# Patient Record
Sex: Male | Born: 1990 | Race: Black or African American | Hispanic: No | Marital: Single | Smoking: Never smoker
Health system: Southern US, Community
[De-identification: ages and names within clinical notes are randomized; demographics above are authoritative.]

---
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
## 2013-09-16 ENCOUNTER — Emergency Department (HOSPITAL_COMMUNITY)
Admission: EM | Admit: 2013-09-16 | Discharge: 2013-09-17 | Disposition: A | Discharge status: Home / Self Care | Payer: Worker's Compensation | Attending: Emergency Medicine | Admitting: Emergency Medicine

## 2013-09-16 ENCOUNTER — Encounter (HOSPITAL_COMMUNITY): Payer: Self-pay | Admitting: Emergency Medicine

## 2013-09-16 ENCOUNTER — Emergency Department (HOSPITAL_COMMUNITY): Payer: Worker's Compensation

## 2013-09-16 DIAGNOSIS — W319XXA Contact with unspecified machinery, initial encounter: Secondary | ICD-10-CM | POA: Insufficient documentation

## 2013-09-16 DIAGNOSIS — S61402A Unspecified open wound of left hand, initial encounter: Secondary | ICD-10-CM

## 2013-09-16 DIAGNOSIS — Y939 Activity, unspecified: Secondary | ICD-10-CM | POA: Insufficient documentation

## 2013-09-16 DIAGNOSIS — S61209A Unspecified open wound of unspecified finger without damage to nail, initial encounter: Secondary | ICD-10-CM | POA: Insufficient documentation

## 2013-09-16 DIAGNOSIS — Y929 Unspecified place or not applicable: Secondary | ICD-10-CM | POA: Insufficient documentation

## 2013-09-16 MED ORDER — CEFAZOLIN SODIUM 1-5 GM-% IV SOLN
1.0000 g | Freq: Once | INTRAVENOUS | Status: AC
  Administered 2013-09-17: 1 g via INTRAVENOUS
  Filled 2013-09-16: qty 50

## 2013-09-16 MED ORDER — HYDROMORPHONE HCL PF 1 MG/ML IJ SOLN
1.0000 mg | Freq: Once | INTRAMUSCULAR | Status: AC
  Administered 2013-09-17: 1 mg via INTRAVENOUS
  Filled 2013-09-16: qty 1

## 2013-09-16 NOTE — ED Notes (Signed)
Pt got left had caught in machine while working; index, middle, and ring fingers. Pt c/o pain 10/10 in field received 200 mg Fent. Pain dropped to 4/10. Pt A&Ox4  Denies LOC. No bleeding noted

## 2013-09-16 NOTE — ED Notes (Signed)
MD at bedside. 

## 2013-09-16 NOTE — ED Provider Notes (Signed)
CSN: [id number]     Arrival date & time 09/16/13  2301 History   First MD Initiated Contact with Patient 09/16/13 2301     Chief Complaint  Patient presents with  . Hand Injury     (Consider location/radiation/quality/duration/timing/severity/associated sxs/prior Treatment) HPI Comments: 23 year old male with no significant medical problems presents with skin avulsion and pain to left index, middle, ring fingers on left hand after hand was caught in a machine work. Patient is constant pain worse with palpation and movement. Patient received fentanyl on route with improved pain.  Tetanus is up-to-date. No allergies. No other injuries.  Patient is a 23 y.o. male presenting with hand injury. The history is provided by the patient.  Hand Injury Associated symptoms: no neck pain     History reviewed. No pertinent past medical history. History reviewed. No pertinent past surgical history. History reviewed. No pertinent family history. History  Substance Use Topics  . Smoking status: Never Smoker   . Smokeless tobacco: Not on file  . Alcohol Use: No    Review of Systems  Respiratory: Negative for shortness of breath.   Cardiovascular: Negative for chest pain.  Gastrointestinal: Negative for vomiting and abdominal pain.  Genitourinary: Negative for dysuria and flank pain.  Musculoskeletal: Negative for neck pain and neck stiffness.  Skin: Positive for wound. Negative for rash.  Neurological: Negative for light-headedness and headaches.      Allergies  Review of patient's allergies indicates not on file.  Home Medications   Prior to Admission medications   Not on File   BP 129/82  Pulse 72  Temp(Src) 97.8 F (36.6 C) (Oral)  Resp 20  SpO2 98% Physical Exam  Nursing note and vitals reviewed. Constitutional: He appears well-developed and well-nourished. No distress.  HENT:  Head: Normocephalic and atraumatic.  Neck: Normal range of motion.  Cardiovascular: Normal rate.    Pulmonary/Chest: Effort normal.  Musculoskeletal: He exhibits tenderness. He exhibits no edema.  Neurological: He is alert.  Skin: Skin is warm.  Patient has skin avulsion left index, middle and ring finger worse middle finger approximately 3 cm of avulsion. Bone not visualized. Pain with any range of motion of those fingers. Sensation intact distally. No pain or involvement of wrist or proximal arm on the left. Difficulty assessing tendon function do to severe pain. Patient's hand is in full extension.  Psychiatric: He has a normal mood and affect.    ED Course  Procedures (including critical care time) Labs Review Labs Reviewed - No data to display  Imaging Review Dg Hand Complete Left  09/17/2013   CLINICAL DATA:  Left hand caught in machine. Pain about the second, third and fourth fingers.  EXAM: LEFT HAND - COMPLETE 3+ VIEW  COMPARISON:  None.  FINDINGS: There is no evidence of fracture or dislocation. The joint spaces are preserved; there is suggestion of mild soft tissue injury about the second, third and fourth digits. The carpal rows are intact, and demonstrate normal alignment.  No radiopaque foreign bodies are seen.  IMPRESSION: No evidence of fracture or dislocation.   Electronically Signed   By: Roanna RaiderJeffery  Chang M.D.   On: 09/17/2013 01:10     EKG Interpretation None      MDM   Final diagnoses:  Avulsion of skin of left hand   Acute hand injury with skin avulsion. Tetanus is up-to-date. Plan for pain medicines, Ancef, x-ray and wound care.  Pain control the ED, Ancef given.  X-ray reviewed no acute fracture.  Followup closely with hand Dr. Pain medicines and antibiotics  Results and differential diagnosis were discussed with the patient. Close follow up outpatient was discussed, patient comfortable with the plan.   Filed Vitals:   09/16/13 2320  BP: 129/82  Pulse: 72  Temp: 97.8 F (36.6 C)  TempSrc: Oral  Resp: 20  SpO2: 98%    Enid SkeensJoshua M Liann Spaeth,  MD 09/17/13 [phone]

## 2013-09-17 MED ORDER — CEPHALEXIN 500 MG PO CAPS: 500.0000 mg | ORAL_CAPSULE | Freq: Two times a day (BID) | ORAL | Status: DC

## 2013-09-17 MED ORDER — HYDROCODONE-ACETAMINOPHEN 5-325 MG PO TABS: 2.0000 | ORAL_TABLET | ORAL | Status: DC | PRN

## 2013-09-17 NOTE — Discharge Instructions (Signed)
Apply antibiotic ointment once daily. Take ibuprofen and use ice for pain. For severe pain take norco or vicodin however realize they have the potential for addiction and it can make you sleepy and has tylenol in it.  No operating machinery while taking. Call orthopedic doctor for recheck. If you were given medicines take as directed.  If you are on coumadin or contraceptives realize their levels and effectiveness is altered by many different medicines.  If you have any reaction (rash, tongues swelling, other) to the medicines stop taking and see a physician.   Please follow up as directed and return to the ER or see a physician for new or worsening symptoms.  Thank you. Filed Vitals:   09/16/13 2320  BP: 129/82  Pulse: 72  Temp: 97.8 F (36.6 C)  TempSrc: Oral  Resp: 20  SpO2: 98%

## 2015-03-21 ENCOUNTER — Emergency Department (HOSPITAL_COMMUNITY)
Admission: EM | Admit: 2015-03-21 | Discharge: 2015-03-21 | Disposition: A | Discharge status: Home / Self Care | Payer: Self-pay | Attending: Emergency Medicine | Admitting: Emergency Medicine

## 2015-03-21 ENCOUNTER — Encounter (HOSPITAL_COMMUNITY): Payer: Self-pay | Admitting: Emergency Medicine

## 2015-03-21 DIAGNOSIS — Z792 Long term (current) use of antibiotics: Secondary | ICD-10-CM | POA: Insufficient documentation

## 2015-03-21 DIAGNOSIS — IMO0002 Reserved for concepts with insufficient information to code with codable children: Secondary | ICD-10-CM

## 2015-03-21 DIAGNOSIS — F10129 Alcohol abuse with intoxication, unspecified: Secondary | ICD-10-CM | POA: Insufficient documentation

## 2015-03-21 NOTE — ED Provider Notes (Signed)
CSN: [id number]     Arrival date & time 03/21/15  1752 History   First MD Initiated Contact with Patient 03/21/15 1824     Chief Complaint  Patient presents with  . Alcohol Intoxication     (Consider location/radiation/quality/duration/timing/severity/associated sxs/prior Treatment) HPI  Drank quite a few beers today and smoked marijuana which he doesn't normally do. Started having 'things fly by my head', jittery, nervous, anxious so brought here. Has improved since being here. No neuro deficits.   History reviewed. No pertinent past medical history. History reviewed. No pertinent past surgical history. No family history on file. Social History  Substance Use Topics  . Smoking status: Never Smoker   . Smokeless tobacco: None  . Alcohol Use: No    Review of Systems  Constitutional: Negative for fever and fatigue.  HENT: Negative for congestion and drooling.   Respiratory: Negative for cough and shortness of breath.   Cardiovascular: Negative for chest pain.  Gastrointestinal: Negative for nausea, vomiting and abdominal pain.  Endocrine: Negative for polydipsia and polyuria.  Genitourinary: Negative for dysuria and hematuria.  Musculoskeletal: Negative for joint swelling.      Allergies  Review of patient's allergies indicates not on file.  Home Medications   Prior to Admission medications   Medication Sig Start Date End Date Taking? Authorizing Provider  cephALEXin (KEFLEX) 500 MG capsule Take 1 capsule (500 mg total) by mouth 2 (two) times daily. 09/17/13   Blane OharaJoshua Zavitz, MD  HYDROcodone-acetaminophen (NORCO) 5-325 MG per tablet Take 2 tablets by mouth every 4 (four) hours as needed. 09/17/13   Blane OharaJoshua Zavitz, MD   BP 115/67 mmHg  Pulse 61  Temp(Src) 98.1 F (36.7 C) (Oral)  Resp 14  SpO2 100% Physical Exam  Constitutional: He appears well-developed and well-nourished.  HENT:  Head: Normocephalic and atraumatic.  Neck: Normal range of motion.  Cardiovascular:  Normal rate.   Pulmonary/Chest: Effort normal. No respiratory distress.  Abdominal: He exhibits no distension.  Musculoskeletal: Normal range of motion.  Neurological: He is alert.  Nursing note and vitals reviewed.   ED Course  Procedures (including critical care time) Labs Review Labs Reviewed - No data to display  Imaging Review No results found. I have personally reviewed and evaluated these images and lab results as part of my medical decision-making.   EKG Interpretation None      MDM   Final diagnoses:  Intoxication   24 yo M without evidence of intoxication now. Ambulates well. Alert. Oriented. Not tachycardic or tachypneic. Lungs clear. Pupils normal. Has sober friend to watch him and bring him back if anything changes.   I have personally and contemperaneously reviewed labs and imaging and used in my decision making as above.   A medical screening exam was performed and I feel the patient has had an appropriate workup for their chief complaint at this time and likelihood of emergent condition existing is low. They have been counseled on decision, discharge, follow up and which symptoms necessitate immediate return to the emergency department. They or their family verbally stated understanding and agreement with plan and discharged in stable condition.      Marily MemosJason Magdaline Zollars, MD 03/22/15 [phone]

## 2015-03-21 NOTE — ED Notes (Signed)
Patient smoking pot and drinking vodka.  First time he had smoked pot and he became anxious.  Alert and oriented, able to ambulate.

## 2015-10-22 ENCOUNTER — Emergency Department (HOSPITAL_COMMUNITY): Payer: Managed Care, Other (non HMO)

## 2015-10-22 ENCOUNTER — Encounter (HOSPITAL_COMMUNITY): Payer: Self-pay

## 2015-10-22 ENCOUNTER — Other Ambulatory Visit: Payer: Self-pay

## 2015-10-22 ENCOUNTER — Emergency Department (HOSPITAL_COMMUNITY)
Admission: EM | Admit: 2015-10-22 | Discharge: 2015-10-22 | Disposition: A | Discharge status: Home / Self Care | Payer: Managed Care, Other (non HMO) | Attending: Emergency Medicine | Admitting: Emergency Medicine

## 2015-10-22 DIAGNOSIS — R079 Chest pain, unspecified: Secondary | ICD-10-CM

## 2015-10-22 DIAGNOSIS — R072 Precordial pain: Secondary | ICD-10-CM | POA: Diagnosis not present

## 2015-10-22 DIAGNOSIS — R42 Dizziness and giddiness: Secondary | ICD-10-CM | POA: Diagnosis not present

## 2015-10-22 LAB — BASIC METABOLIC PANEL
ANION GAP: 7 (ref 5–15)
BUN: 8 mg/dL (ref 6–20)
CALCIUM: 9.5 mg/dL (ref 8.9–10.3)
CO2: 26 mmol/L (ref 22–32)
Chloride: 105 mmol/L (ref 101–111)
Creatinine, Ser: 1.15 mg/dL (ref 0.61–1.24)
Glucose, Bld: 112 mg/dL — ABNORMAL HIGH (ref 65–99)
Potassium: 3.7 mmol/L (ref 3.5–5.1)
SODIUM: 138 mmol/L (ref 135–145)

## 2015-10-22 LAB — CBC
HCT: 41.6 % (ref 39.0–52.0)
HEMOGLOBIN: 13.7 g/dL (ref 13.0–17.0)
MCH: 27 pg (ref 26.0–34.0)
MCHC: 32.9 g/dL (ref 30.0–36.0)
MCV: 81.9 fL (ref 78.0–100.0)
PLATELETS: 167 10*3/uL (ref 150–400)
RBC: 5.08 MIL/uL (ref 4.22–5.81)
RDW: 12.4 % (ref 11.5–15.5)
WBC: 5 10*3/uL (ref 4.0–10.5)

## 2015-10-22 LAB — I-STAT TROPONIN, ED: TROPONIN I, POC: 0 ng/mL (ref 0.00–0.08)

## 2015-10-22 NOTE — Discharge Instructions (Signed)
I recommend drinking at least six 8 ounce glasses of water daily to remain hydrated at home.  Please follow up with a primary care provider from the Resource Guide provided below in 1 week if your symptoms of chest pain and lightheadedness have not improved. Please return to the Emergency Department if symptoms worsen or new onset of fever, new chest pain, difficulty breathing, visual changes, vomiting, numbness, tingling, weakness, syncope, seizure.

## 2015-10-22 NOTE — ED Notes (Addendum)
Patient states that he awoke with chest pain that radiates everywhere this am. with dizziness. No distress on arrival. States that he has nausea with same. denies cold symptoms , denies cough.

## 2015-10-22 NOTE — ED Provider Notes (Signed)
CSN: [id number]     Arrival date & time 10/22/15  1533 History   First MD Initiated Contact with Patient 10/22/15 1917     Chief Complaint  Patient presents with  . Chest Pain  . Dizziness     (Consider location/radiation/quality/duration/timing/severity/associated sxs/prior Treatment) HPI   Pt is a 25 yo male with no PMH who presents to the ED with complaint of CP, onset yesterday. Pt reports having intermittent sharp pain to his midsternal chest that lasts appx. 30 minutes and then resolves spontaneously, denies radiation. He notes the pain typically occurs while at rest. Denies any recent injury or new exercise/workout. He notes the pain will intermittently be worse with movement, denies any alleviating factors. Denies hx of CP or cardiac hx, denies family hx of cardiac disease. Pt denies taking any meds PTA. He notes his last episodes of CP started around 3pm and resolved appx. 30 minutes later while he was in the ED lobby.   Pt also reports having intermittent lightheadedness which he notes has been present for the past 2 days. He notes the lightheadedness typically occurs when changing position from sitting/laying to standing. He notes he has been fasting from 4am to 8pm for the past 6 days for his religion. He also endorses associated nausea. Denies fevers, chills, visual changes, neck pain/stiffness, cough, SOB, palpitations, vomiting, abdominal pain, numbness, tingling, weakness, syncope, seizure.   History reviewed. No pertinent past medical history. History reviewed. No pertinent past surgical history. No family history on file. Social History  Substance Use Topics  . Smoking status: Never Smoker   . Smokeless tobacco: None  . Alcohol Use: No    Review of Systems  Cardiovascular: Positive for chest pain.  Gastrointestinal: Positive for nausea.  Neurological: Positive for light-headedness.  All other systems reviewed and are negative.     Allergies  Review of patient's  allergies indicates no known allergies.  Home Medications   Prior to Admission medications   Medication Sig Start Date End Date Taking? Authorizing Provider  cephALEXin (KEFLEX) 500 MG capsule Take 1 capsule (500 mg total) by mouth 2 (two) times daily. 09/17/13   Blane OharaJoshua Zavitz, MD  HYDROcodone-acetaminophen (NORCO) 5-325 MG per tablet Take 2 tablets by mouth every 4 (four) hours as needed. 09/17/13   Blane OharaJoshua Zavitz, MD   BP 137/79 mmHg  Pulse 88  Temp(Src) 99 F (37.2 C) (Oral)  Resp 18  Ht 5\' 3"  (1.6 m)  Wt 78.926 kg  BMI 30.83 kg/m2  SpO2 100% Physical Exam  Constitutional: He is oriented to person, place, and time. He appears well-developed and well-nourished. No distress.  HENT:  Head: Normocephalic and atraumatic.  Right Ear: Tympanic membrane normal.  Nose: Nose normal.  Mouth/Throat: Uvula is midline, oropharynx is clear and moist and mucous membranes are normal. No oropharyngeal exudate, posterior oropharyngeal edema, posterior oropharyngeal erythema or tonsillar abscesses.  Eyes: Conjunctivae and EOM are normal. Pupils are equal, round, and reactive to light. Right eye exhibits no discharge. Left eye exhibits no discharge. No scleral icterus.  No nystagmus  Neck: Normal range of motion. Neck supple.  Cardiovascular: Normal rate, regular rhythm, normal heart sounds and intact distal pulses.   Pulmonary/Chest: Effort normal and breath sounds normal. No respiratory distress. He has no wheezes. He has no rales. He exhibits no tenderness.  Abdominal: Soft. Bowel sounds are normal. He exhibits no distension and no mass. There is no tenderness. There is no rebound and no guarding.  Musculoskeletal: Normal range of motion.  He exhibits no edema or tenderness.  Neurological: He is alert and oriented to person, place, and time. He has normal strength. No cranial nerve deficit or sensory deficit. He displays a negative Romberg sign. Coordination and gait normal.  Pt able to stand and  ambulate without assistance, no ataxia noted.  Skin: Skin is warm and dry. He is not diaphoretic.  Nursing note and vitals reviewed.   ED Course  Procedures (including critical care time) Labs Review Labs Reviewed  BASIC METABOLIC PANEL - Abnormal; Notable for the following:    Glucose, Bld 112 (*)    All other components within normal limits  CBC  I-STAT TROPOININ, ED    Imaging Review Dg Chest 2 View  10/22/2015  CLINICAL DATA:  Dizziness, shortness of breath and chest pain for 3 days. Initial encounter. EXAM: CHEST  2 VIEW COMPARISON:  None. FINDINGS: The lungs are clear. Heart size is normal. No pneumothorax or pleural effusion. No bony abnormality. IMPRESSION: Normal chest. Electronically Signed   By: Drusilla Kanner M.D.   On: 10/22/2015 16:45   I have personally reviewed and evaluated these images and lab results as part of my medical decision-making.   EKG Interpretation   Date/Time:  Thursday October 22 2015 15:38:16 EDT Ventricular Rate:  69 PR Interval:  150 QRS Duration: 80 QT Interval:  386 QTC Calculation: 413 R Axis:   58 Text Interpretation:  Normal sinus rhythm with sinus arrhythmia T wave  abnormality, consider anterior ischemia Abnormal ECG No old tracing to  compare Confirmed by [facility]  MD, MARTHA [phone]) on 10/22/2015 7:30:41 PM      MDM   Final diagnoses:  Chest pain, unspecified chest pain type  Lightheadedness    Pt presents with intermittent CP and intermittent episodes of lightheadedness that started after he began fasting this week. Denies hx of cardiac dx or cardiac risk factors. Pt reports his sxs resolved while in the ED lobby and denies any pain or complaints at this time. VSS. Exam unremarkable. No neuro deficits, pt able to stand and ambulate without ataxia. HEART score 1.   EKG showed NSR with T wave abnormality, no prior EKG to compare. Trop negative. CXR negative. Labs unremarkable. Orthostatics negative. I have a low suspicion for ACS,  PE, dissection, or other acute cardiac event at this time. I suspect pt's lightheadedness is likely attributed to his recent initiation of fasting. Advised pt to continue to drink fluids throughout the day if he continued to have lightheadedness to remain hydrated. Discussed results and plan for d/c with pt. Pt advised to follow up with PCP in the next week if his sxs have not improved.     Satira Sark [place], [place] 10/22/15 2033  Jerelyn Scott, MD 10/22/15 2033

## 2015-10-26 ENCOUNTER — Encounter (HOSPITAL_COMMUNITY): Payer: Self-pay | Admitting: Emergency Medicine

## 2015-10-26 ENCOUNTER — Emergency Department (HOSPITAL_COMMUNITY)
Admission: EM | Admit: 2015-10-26 | Discharge: 2015-10-26 | Disposition: A | Discharge status: Home / Self Care | Payer: 59 | Attending: Emergency Medicine | Admitting: Emergency Medicine

## 2015-10-26 DIAGNOSIS — X30XXXA Exposure to excessive natural heat, initial encounter: Secondary | ICD-10-CM | POA: Insufficient documentation

## 2015-10-26 DIAGNOSIS — R42 Dizziness and giddiness: Secondary | ICD-10-CM | POA: Insufficient documentation

## 2015-10-26 DIAGNOSIS — Y990 Civilian activity done for income or pay: Secondary | ICD-10-CM | POA: Diagnosis not present

## 2015-10-26 DIAGNOSIS — R531 Weakness: Secondary | ICD-10-CM | POA: Diagnosis not present

## 2015-10-26 DIAGNOSIS — Y9389 Activity, other specified: Secondary | ICD-10-CM | POA: Diagnosis not present

## 2015-10-26 DIAGNOSIS — Y9289 Other specified places as the place of occurrence of the external cause: Secondary | ICD-10-CM | POA: Insufficient documentation

## 2015-10-26 DIAGNOSIS — R110 Nausea: Secondary | ICD-10-CM | POA: Insufficient documentation

## 2015-10-26 DIAGNOSIS — T679XXA Effect of heat and light, unspecified, initial encounter: Secondary | ICD-10-CM

## 2015-10-26 LAB — URINALYSIS, ROUTINE W REFLEX MICROSCOPIC
Bilirubin Urine: NEGATIVE
GLUCOSE, UA: NEGATIVE mg/dL
Hgb urine dipstick: NEGATIVE
KETONES UR: NEGATIVE mg/dL
LEUKOCYTES UA: NEGATIVE
NITRITE: NEGATIVE
PH: 6 (ref 5.0–8.0)
Protein, ur: NEGATIVE mg/dL
Specific Gravity, Urine: 1.008 (ref 1.005–1.030)

## 2015-10-26 LAB — RAPID URINE DRUG SCREEN, HOSP PERFORMED
AMPHETAMINES: NOT DETECTED
BARBITURATES: NOT DETECTED
BENZODIAZEPINES: NOT DETECTED
COCAINE: NOT DETECTED
Opiates: NOT DETECTED
Tetrahydrocannabinol: NOT DETECTED

## 2015-10-26 LAB — COMPREHENSIVE METABOLIC PANEL
ALT: 21 U/L (ref 17–63)
ANION GAP: 5 (ref 5–15)
AST: 21 U/L (ref 15–41)
Albumin: 3.9 g/dL (ref 3.5–5.0)
Alkaline Phosphatase: 47 U/L (ref 38–126)
BILIRUBIN TOTAL: 0.6 mg/dL (ref 0.3–1.2)
BUN: 8 mg/dL (ref 6–20)
CHLORIDE: 106 mmol/L (ref 101–111)
CO2: 27 mmol/L (ref 22–32)
Calcium: 9.5 mg/dL (ref 8.9–10.3)
Creatinine, Ser: 1.06 mg/dL (ref 0.61–1.24)
Glucose, Bld: 123 mg/dL — ABNORMAL HIGH (ref 65–99)
POTASSIUM: 3.5 mmol/L (ref 3.5–5.1)
Sodium: 138 mmol/L (ref 135–145)
TOTAL PROTEIN: 7.6 g/dL (ref 6.5–8.1)

## 2015-10-26 LAB — CBC
HEMATOCRIT: 40 % (ref 39.0–52.0)
HEMOGLOBIN: 13.5 g/dL (ref 13.0–17.0)
MCH: 27 pg (ref 26.0–34.0)
MCHC: 33.8 g/dL (ref 30.0–36.0)
MCV: 80 fL (ref 78.0–100.0)
Platelets: 156 10*3/uL (ref 150–400)
RBC: 5 MIL/uL (ref 4.22–5.81)
RDW: 12.1 % (ref 11.5–15.5)
WBC: 5.1 10*3/uL (ref 4.0–10.5)

## 2015-10-26 MED ORDER — ONDANSETRON 4 MG PO TBDP: 4.0000 mg | ORAL_TABLET | Freq: Three times a day (TID) | ORAL | Status: DC | PRN

## 2015-10-26 NOTE — ED Notes (Signed)
Patient with nausea, vomiting and dizziness while he was at work.  Patient states that he was here last week for same, but has started feeling like he did last week.  He was told he was dehydrated last week.

## 2015-10-26 NOTE — Discharge Instructions (Signed)
Dizziness Dizziness is a common problem. It is a feeling of unsteadiness or light-headedness. You may feel like you are about to faint. Dizziness can lead to injury if you stumble or fall. Anyone can become dizzy, but dizziness is more common in older adults. This condition can be caused by a number of things, including medicines, dehydration, or illness. HOME CARE INSTRUCTIONS Taking these steps may help with your condition: Eating and Drinking  Drink enough fluid to keep your urine clear or pale yellow. This helps to keep you from becoming dehydrated. Try to drink more clear fluids, such as water.  Do not drink alcohol.  Limit your caffeine intake if directed by your health care provider.  Limit your salt intake if directed by your health care provider. Activity  Avoid making quick movements.  Rise slowly from chairs and steady yourself until you feel okay.  In the morning, first sit up on the side of the bed. When you feel okay, stand slowly while you hold onto something until you know that your balance is fine.  Move your legs often if you need to stand in one place for a long time. Tighten and relax your muscles in your legs while you are standing.  Do not drive or operate heavy machinery if you feel dizzy.  Avoid bending down if you feel dizzy. Place items in your home so that they are easy for you to reach without leaning over. Lifestyle  Do not use any tobacco products, including cigarettes, chewing tobacco, or electronic cigarettes. If you need help quitting, ask your health care provider.  Try to reduce your stress level, such as with yoga or meditation. Talk with your health care provider if you need help. General Instructions  Watch your dizziness for any changes.  Take medicines only as directed by your health care provider. Talk with your health care provider if you think that your dizziness is caused by a medicine that you are taking.  Tell a friend or a family  member that you are feeling dizzy. If he or she notices any changes in your behavior, have this person call your health care provider.  Keep all follow-up visits as directed by your health care provider. This is important. SEEK MEDICAL CARE IF:  Your dizziness does not go away.  Your dizziness or light-headedness gets worse.  You feel nauseous.  You have reduced hearing.  You have new symptoms.  You are unsteady on your feet or you feel like the room is spinning. SEEK IMMEDIATE MEDICAL CARE IF:  You vomit or have diarrhea and are unable to eat or drink anything.  You have problems talking, walking, swallowing, or using your arms, hands, or legs.  You feel generally weak.  You are not thinking clearly or you have trouble forming sentences. It may take a friend or family member to notice this.  You have chest pain, abdominal pain, shortness of breath, or sweating.  Your vision changes.  You notice any bleeding.  You have a headache.  You have neck pain or a stiff neck.  You have a fever.   This information is not intended to replace advice given to you by your health care provider. Make sure you discuss any questions you have with your health care provider.   Document Released: 11/02/2000 Document Revised: 09/23/2014 Document Reviewed: 05/05/2014 Elsevier Interactive Patient Education [employer] Try to drink more fluids, take breaks into cool environment

## 2015-10-26 NOTE — ED Provider Notes (Signed)
CSN: [id number]     Arrival date & time 10/26/15  1817 History   First MD Initiated Contact with Patient 10/26/15 2129     Chief Complaint  Patient presents with  . Emesis  . Nausea     (Consider location/radiation/quality/duration/timing/severity/associated sxs/prior Treatment) HPI Comments: This a 25 year old male who reports the emergency room tonight stating that while working in a very hot environment.  He becomes lightheaded, nauseated and weak.  This resolves when he goes into the break room where his air-conditioned or when he leaves work for the night.  He works second shift.  He's worked this same job for 3 years and this is never happened to him before.  He was seen 4 days ago for the same with a negative evaluation.  He states he is drinking one bottle of water on breaks and it mealtime, but he has not urinating more than every 5-6 hours.  He denies any recent illness, no vomiting, diarrhea, URI symptoms, sinus congestion.  Patient is a 25 y.o. male presenting with dizziness. The history is provided by the patient.  Dizziness Quality:  Lightheadedness Severity:  Mild Onset quality:  Gradual Timing:  Intermittent Progression:  Resolved Chronicity:  Recurrent Context comment:  While at work in a hot environment Relieved by: Going into cool environment. Worsened by:  Nothing Ineffective treatments:  None tried Associated symptoms: nausea and weakness   Associated symptoms: no blood in stool, no chest pain, no diarrhea, no headaches, no hearing loss, no palpitations, no shortness of breath, no syncope, no tinnitus, no vision changes and no vomiting   Nausea:    Severity:  Mild   Onset quality:  Unable to specify Weakness:    Severity:  Mild   Onset quality:  Gradual   Timing:  Sporadic   History reviewed. No pertinent past medical history. History reviewed. No pertinent past surgical history. No family history on file. Social History  Substance Use Topics  . Smoking  status: Never Smoker   . Smokeless tobacco: None  . Alcohol Use: No    Review of Systems  Constitutional: Negative for fever and chills.  HENT: Negative for hearing loss and tinnitus.   Eyes: Negative for visual disturbance.  Respiratory: Negative for shortness of breath.   Cardiovascular: Negative for chest pain, palpitations and syncope.  Gastrointestinal: Positive for nausea. Negative for vomiting, diarrhea, constipation and blood in stool.  Genitourinary: Negative for dysuria.  Skin: Negative for rash.  Neurological: Positive for dizziness, weakness and light-headedness. Negative for headaches.  All other systems reviewed and are negative.     Allergies  Review of patient's allergies indicates no known allergies.  Home Medications   Prior to Admission medications   Medication Sig Start Date End Date Taking? Authorizing Provider  cephALEXin (KEFLEX) 500 MG capsule Take 1 capsule (500 mg total) by mouth 2 (two) times daily. Patient not taking: Reported on 10/26/2015 09/17/13   Blane OharaJoshua Zavitz, MD  HYDROcodone-acetaminophen [facility](NORCO) 5-325 MG per tablet Take 2 tablets by mouth every 4 (four) hours as needed. Patient not taking: Reported on 10/26/2015 09/17/13   Blane OharaJoshua Zavitz, MD  ondansetron (ZOFRAN ODT) 4 MG disintegrating tablet Take 1 tablet (4 mg total) by mouth every 8 (eight) hours as needed for nausea or vomiting. 10/26/15   Earley FavorGail Gyan Cambre, NP   BP 122/68 mmHg  Pulse 55  Temp(Src) 98.1 F (36.7 C) (Oral)  Resp 20  SpO2 99% Physical Exam  Constitutional: He is oriented to person, place, and time.  He appears well-developed and well-nourished. No distress.  HENT:  Head: Atraumatic.  Right Ear: External ear normal.  Left Ear: External ear normal.  Neck: Normal range of motion.  Cardiovascular: Normal rate and regular rhythm.   Pulmonary/Chest: Effort normal and breath sounds normal. He exhibits no tenderness.  Abdominal: Soft.  Musculoskeletal: Normal range of motion. He  exhibits no edema or tenderness.  Neurological: He is alert and oriented to person, place, and time.  Skin: Skin is warm and dry. No rash noted.  Nursing note and vitals reviewed.   ED Course  Procedures (including critical care time) Labs Review Labs Reviewed  COMPREHENSIVE METABOLIC PANEL - Abnormal; Notable for the following:    Glucose, Bld 123 (*)    All other components within normal limits  CBC  URINALYSIS, ROUTINE W REFLEX MICROSCOPIC (NOT AT [facility])  URINE RAPID DRUG SCREEN, HOSP PERFORMED    Imaging Review No results found. I have personally reviewed and evaluated these images and lab results as part of my medical decision-making.   EKG Interpretation None      MDM   Final diagnoses:  Dizziness  Nausea  Heat exposure, initial encounter         Earley Favor, NP 10/26/15 1610  Pricilla Loveless, MD 10/27/15 [phone]

## 2015-10-31 ENCOUNTER — Emergency Department (HOSPITAL_COMMUNITY)
Admission: EM | Admit: 2015-10-31 | Discharge: 2015-10-31 | Disposition: A | Discharge status: Home / Self Care | Payer: 59 | Attending: Emergency Medicine | Admitting: Emergency Medicine

## 2015-10-31 ENCOUNTER — Encounter (HOSPITAL_COMMUNITY): Payer: Self-pay | Admitting: Emergency Medicine

## 2015-10-31 DIAGNOSIS — J069 Acute upper respiratory infection, unspecified: Secondary | ICD-10-CM | POA: Insufficient documentation

## 2015-10-31 DIAGNOSIS — R05 Cough: Secondary | ICD-10-CM | POA: Diagnosis present

## 2015-10-31 DIAGNOSIS — B9789 Other viral agents as the cause of diseases classified elsewhere: Secondary | ICD-10-CM

## 2015-10-31 DIAGNOSIS — J3489 Other specified disorders of nose and nasal sinuses: Secondary | ICD-10-CM

## 2015-10-31 DIAGNOSIS — Z79899 Other long term (current) drug therapy: Secondary | ICD-10-CM | POA: Insufficient documentation

## 2015-10-31 MED ORDER — GUAIFENESIN ER 600 MG PO TB12: 1200.0000 mg | ORAL_TABLET | Freq: Two times a day (BID) | ORAL | Status: DC

## 2015-10-31 MED ORDER — BENZONATATE 100 MG PO CAPS
200.0000 mg | ORAL_CAPSULE | Freq: Once | ORAL | Status: AC
  Administered 2015-10-31: 200 mg via ORAL
  Filled 2015-10-31: qty 2

## 2015-10-31 MED ORDER — IBUPROFEN 800 MG PO TABS
800.0000 mg | ORAL_TABLET | Freq: Once | ORAL | Status: AC
Start: 2015-10-31 — End: 2015-10-31
  Administered 2015-10-31: 800 mg via ORAL
  Filled 2015-10-31: qty 1

## 2015-10-31 MED ORDER — BENZONATATE 100 MG PO CAPS: 100.0000 mg | ORAL_CAPSULE | Freq: Three times a day (TID) | ORAL | Status: DC

## 2015-10-31 MED ORDER — FLUTICASONE PROPIONATE 50 MCG/ACT NA SUSP: 2.0000 | Freq: Every day | NASAL | Status: DC

## 2015-10-31 NOTE — Discharge Instructions (Signed)
1. Medications: flonase, mucinex, tessalon, usual home medications °2. Treatment: rest, drink plenty of fluids, take tylenol or ibuprofen for fever control °3. Follow Up: Please followup with your primary doctor in 3 days for discussion of your diagnoses and further evaluation after today's visit; if you do not have a primary care doctor use the resource guide provided to find one; Return to the ER for high fevers, difficulty breathing or other concerning symptoms ° °

## 2015-10-31 NOTE — ED Provider Notes (Signed)
CSN: [id number]     Arrival date & time 10/31/15  [id number] History   First MD Initiated Contact with Patient 10/31/15 0525     Chief Complaint  Patient presents with  . Cough     (Consider location/radiation/quality/duration/timing/severity/associated sxs/prior Treatment) The history is provided by the patient and medical records. No language interpreter was used.     Justin Strong is a [age] y.o. male  With no major medical hx presents to the Emergency Department complaining of gradual, persistent, Cough onset 1 AM this morning. No treatments prior to arrival. Patient has associated rhinorrhea, postnasal drip and generalized headache. No history of migraines. No vision changes, nausea or vomiting. No numbness or tingling. Patient denies sick contacts. No aggravating or alleviating factors. He denies fever, chills, neck pain, neck stiffness, abdominal pain, weakness, dizziness, syncope.     History reviewed. No pertinent past medical history. History reviewed. No pertinent past surgical history. No family history on file. Social History  Substance Use Topics  . Smoking status: Never Smoker   . Smokeless tobacco: None  . Alcohol Use: No    Review of Systems  Constitutional: Negative for fever, diaphoresis, appetite change, fatigue and unexpected weight change.  HENT: Positive for congestion, postnasal drip, rhinorrhea and sinus pressure. Negative for drooling, ear pain, mouth sores, sore throat and trouble swallowing.   Eyes: Negative for visual disturbance.  Respiratory: Positive for cough. Negative for chest tightness, shortness of breath and wheezing.   Cardiovascular: Negative for chest pain.  Gastrointestinal: Negative for nausea, vomiting, abdominal pain, diarrhea and constipation.  Endocrine: Negative for polydipsia, polyphagia and polyuria.  Genitourinary: Negative for dysuria, urgency, frequency and hematuria.  Musculoskeletal: Negative for back pain and neck stiffness.  Skin:  Negative for rash.  Allergic/Immunologic: Negative for immunocompromised state.  Neurological: Negative for syncope, light-headedness and headaches.  Hematological: Does not bruise/bleed easily.  Psychiatric/Behavioral: Negative for sleep disturbance. The patient is not nervous/anxious.       Allergies  Review of patient's allergies indicates no known allergies.  Home Medications   Prior to Admission medications   Medication Sig Start Date End Date Taking? Authorizing Provider  benzonatate (TESSALON) 100 MG capsule Take 1 capsule (100 mg total) by mouth every 8 (eight) hours. 10/31/15   Haley Fuerstenberg, PA-C  cephALEXin (KEFLEX) 500 MG capsule Take 1 capsule (500 mg total) by mouth 2 (two) times daily. Patient not taking: Reported on 10/26/2015 09/17/13   Blane OharaJoshua Zavitz, MD  fluticasone [facility](FLONASE) 50 MCG/ACT nasal spray Place 2 sprays into both nostrils daily. 10/31/15   Tahirah Sara, PA-C  guaiFENesin (MUCINEX) 600 MG 12 hr tablet Take 2 tablets (1,200 mg total) by mouth 2 (two) times daily. 10/31/15   Annmargaret Decaprio, PA-C  HYDROcodone-acetaminophen (NORCO) 5-325 MG per tablet Take 2 tablets by mouth every 4 (four) hours as needed. Patient not taking: Reported on 10/26/2015 09/17/13   Blane OharaJoshua Zavitz, MD  ondansetron (ZOFRAN ODT) 4 MG disintegrating tablet Take 1 tablet (4 mg total) by mouth every 8 (eight) hours as needed for nausea or vomiting. 10/26/15   Earley FavorGail Schulz, NP   BP 141/87 mmHg  Pulse 63  Temp(Src) 97.6 F (36.4 C) (Oral)  Resp 16  SpO2 100% Physical Exam  Constitutional: He appears well-developed and well-nourished. No distress.  HENT:  Head: Normocephalic and atraumatic.  Right Ear: Tympanic membrane, external ear and ear canal normal.  Left Ear: Tympanic membrane, external ear and ear canal normal.  Nose: Mucosal edema and rhinorrhea present. No epistaxis.  Right sinus exhibits no maxillary sinus tenderness and no frontal sinus tenderness. Left sinus exhibits no  maxillary sinus tenderness and no frontal sinus tenderness.  Mouth/Throat: Uvula is midline and mucous membranes are normal. Mucous membranes are not pale and not cyanotic. No oropharyngeal exudate, posterior oropharyngeal edema, posterior oropharyngeal erythema or tonsillar abscesses.  Eyes: Conjunctivae are normal. Pupils are equal, round, and reactive to light.  Neck: Normal range of motion and full passive range of motion without pain.  Cardiovascular: Normal rate, normal heart sounds and intact distal pulses.   No murmur heard. Pulmonary/Chest: Effort normal and breath sounds normal. No stridor.  Clear and equal breath sounds without focal wheezes, rhonchi, rales  Abdominal: Soft. There is no tenderness.  Musculoskeletal: Normal range of motion.  Lymphadenopathy:    He has no cervical adenopathy.  Neurological: He is alert.  Skin: Skin is warm and dry. No rash noted. He is not diaphoretic.  Psychiatric: He has a normal mood and affect.  Nursing note and vitals reviewed.   ED Course  Procedures (including critical care time)   MDM   Final diagnoses:  Viral URI with cough  Rhinorrhea   Justin Strong presents with 5 hours of cough.  No fever or chills.  No focal breath sounds.  No tachycardia.  Doubt PNA.  No CXR indicated at this time.  Patients symptoms are consistent with URI, likely viral etiology. Discussed that antibiotics are not indicated for viral infections. Pt will be discharged with symptomatic treatment.  Verbalizes understanding and is agreeable with plan. Pt is hemodynamically stable & in NAD prior to dc.   Dahlia Client Taniesha Glanz, PA-C 10/31/15 1610  Gwyneth Sprout, MD 10/31/15 [phone]

## 2015-10-31 NOTE — ED Notes (Signed)
Per pt, he has been unable to sleep for the past few nights due to a cough. Denies shortness of breath.

## 2015-11-12 ENCOUNTER — Ambulatory Visit: Payer: Self-pay | Admitting: Family Medicine

## 2015-11-19 ENCOUNTER — Encounter: Payer: Self-pay | Admitting: Family Medicine

## 2015-11-19 ENCOUNTER — Ambulatory Visit (INDEPENDENT_AMBULATORY_CARE_PROVIDER_SITE_OTHER): Payer: Managed Care, Other (non HMO) | Admitting: Family Medicine

## 2015-11-19 VITALS — BP 120/80 | HR 62 | Temp 98.1°F | Resp 12 | Ht 63.0 in | Wt 173.0 lb

## 2015-11-19 DIAGNOSIS — Z6830 Body mass index (BMI) 30.0-30.9, adult: Secondary | ICD-10-CM | POA: Diagnosis not present

## 2015-11-19 DIAGNOSIS — J3089 Other allergic rhinitis: Secondary | ICD-10-CM

## 2015-11-19 MED ORDER — FLUTICASONE PROPIONATE 50 MCG/ACT NA SUSP: 2.0000 | Freq: Every day | NASAL | Status: DC

## 2015-11-19 NOTE — Progress Notes (Signed)
Pre visit review using our clinic review tool, if applicable. No additional management support is needed unless otherwise documented below in the visit note. 

## 2015-11-19 NOTE — Patient Instructions (Signed)
A few things to remember from today's visit:   1. Other allergic rhinitis  - fluticasone (FLONASE) 50 MCG/ACT nasal spray; Place 2 sprays into both nostrils daily.  Dispense: 16 g; Refill: 3   Allegra 180 mg plain daily. Nasal saline .   Cough and nasal congestion could last a few days and sometimes weeks.  Continue heathy diet and regular exercise.  Follow annually, before if needed.    If you sign-up for My chart, you can communicate easier with us in case you have any question or concern.

## 2015-11-19 NOTE — Progress Notes (Signed)
HPI:   Justin Strong is a 25 y.o.male here today with his girlfriend to establish care with me and complaining of persistent "congestion."   He was seen on 10/26/15 because URI acute and dizziness. Overall he is feeling much better, he denies any fevers or chills.  No changes in appetite, odynophagia, or myalgias.   Mild cough, non productive. Mild nasal congestion and rhinorrhea.  [place], today states that when he was 25 years old, he is reporting vaccinations up-to-date.  He has not noted chest pain, dyspnea, or wheezing.  No Hx of recent travel. No sick contact. No known insect bite. No Hx of allergies.  Flonase nasal spray helps, lost med 2 days ago.  -He is overall healthy, he has not had a PCP in years. He lives with his girlfriend. In general he eats healthy and exercises regularly.  He doesn't have any other concerns today.      Review of Systems  Constitutional: Negative for fever, chills, activity change, appetite change and fatigue.  HENT: Positive for congestion and rhinorrhea. Negative for ear pain, mouth sores, postnasal drip, sore throat, trouble swallowing and voice change.   Eyes: Negative for discharge, redness and itching.  Respiratory: Positive for cough. Negative for chest tightness, shortness of breath and wheezing.   Cardiovascular: Negative for chest pain, palpitations and leg swelling.  Gastrointestinal: Negative for nausea, vomiting and abdominal pain.       No changes in bowel habits.  Musculoskeletal: Negative for back pain, joint swelling and neck pain.  Skin: Negative for rash.  Allergic/Immunologic: Negative for environmental allergies.  Neurological: Negative for syncope, weakness and headaches.  Hematological: Negative for adenopathy. Does not bruise/bleed easily.  Psychiatric/Behavioral: Negative for behavioral problems and sleep disturbance. The patient is not nervous/anxious.       Current Outpatient  Prescriptions on File Prior to Visit  Medication Sig Dispense Refill  . guaiFENesin (MUCINEX) 600 MG 12 hr tablet Take 2 tablets (1,200 mg total) by mouth 2 (two) times daily. 20 tablet 0   No current facility-administered medications on file prior to visit.     History reviewed. No pertinent past medical history. No Known Allergies  Social History   Social History  . Marital Status: Single    Spouse Name: N/A  . Number of Children: N/A  . Years of Education: N/A   Social History Main Topics  . Smoking status: Never Smoker   . Smokeless tobacco: None  . Alcohol Use: No  . Drug Use: None  . Sexual Activity: Not Asked   Other Topics Concern  . None   Social History Narrative    Filed Vitals:   11/19/15 1057  BP: 120/80  Pulse: 62  Temp: 98.1 F (36.7 C)  Resp: 12   Body mass index is 30.65 kg/(m^2).   SpO2 Readings from Last 3 Encounters:  11/19/15 99%  10/31/15 100%  10/26/15 99%      Physical Exam  Constitutional: He is oriented to person, place, and time. He appears well-developed. No distress.  HENT:  Head: Atraumatic.  Right Ear: External ear and ear canal normal. A middle ear effusion (Minimal) is present.  Left Ear: Tympanic membrane, external ear and ear canal normal.  Nose: Rhinorrhea present. No mucosal edema. Right sinus exhibits no maxillary sinus tenderness and no frontal sinus tenderness. Left sinus exhibits no maxillary sinus tenderness and no frontal sinus tenderness.  Mouth/Throat: Oropharynx is clear and moist and mucous  membranes are normal.  Atrophic turbinates, hyperemic nasal mucosa. Postnasal drainage.  Eyes: Conjunctivae and EOM are normal. Pupils are equal, round, and reactive to light.  Cardiovascular: Normal rate and regular rhythm.   No murmur heard. Pulses:      Dorsalis pedis pulses are 2+ on the right side, and 2+ on the left side.  Respiratory: Effort normal and breath sounds normal. No respiratory distress.  No cough  during office visit.  GI: Soft. He exhibits no mass. There is no tenderness.  Musculoskeletal: He exhibits no edema.  Lymphadenopathy:    He has no cervical adenopathy.  Neurological: He is alert and oriented to person, place, and time. He has normal strength.  Skin: Skin is warm. No erythema.  Psychiatric: He has a normal mood and affect.  Well groomed, good eye contact.      ASSESSMENT AND PLAN:     Justin Strong was seen today for new patient (initial visit).  Diagnoses and all orders for this visit:  Other allergic rhinitis  Explained that residual symptoms after upper respiratory infections can last a few days and sometimes weeks. He seems like most  symptoms have resolved. He seems to have some mild allergy component, he can continue Flonase nasal spray as needed. We discussed some side effects of nasal steroid. Over-the-counter Allegra 180 mg daily might also help. Follow-up as needed.  -     fluticasone (FLONASE) 50 MCG/ACT nasal spray; Place 2 sprays into both nostrils daily.  BMI 30.0-30.9,adult  We discussed importance of healthy diet and regular physical activity. In general it seems like he eats healthy, African cuisine,  recommended being cautious with meal portions.  We also discussed preventative recommendation guidelines for his age. He can follow annually for his routine physical exam.         -He was advised to return or notify a doctor immediately if symptoms worsen or persist or new concerns arise, voices understanding.       Justin Goodwill G. [place]az[place], MD  [facility]. [place] office.

## 2016-01-20 ENCOUNTER — Encounter: Payer: Self-pay | Admitting: Family Medicine

## 2016-01-20 ENCOUNTER — Ambulatory Visit (INDEPENDENT_AMBULATORY_CARE_PROVIDER_SITE_OTHER): Payer: Managed Care, Other (non HMO) | Admitting: Family Medicine

## 2016-01-20 VITALS — BP 140/90 | HR 70 | Temp 97.9°F | Resp 12 | Ht 63.0 in | Wt 176.1 lb

## 2016-01-20 DIAGNOSIS — J309 Allergic rhinitis, unspecified: Secondary | ICD-10-CM

## 2016-01-20 DIAGNOSIS — R030 Elevated blood-pressure reading, without diagnosis of hypertension: Secondary | ICD-10-CM | POA: Diagnosis not present

## 2016-01-20 DIAGNOSIS — R739 Hyperglycemia, unspecified: Secondary | ICD-10-CM

## 2016-01-20 DIAGNOSIS — K219 Gastro-esophageal reflux disease without esophagitis: Secondary | ICD-10-CM | POA: Diagnosis not present

## 2016-01-20 LAB — POCT GLYCOSYLATED HEMOGLOBIN (HGB A1C): Hemoglobin A1C: 5.3

## 2016-01-20 MED ORDER — MOMETASONE FUROATE 50 MCG/ACT NA SUSP: 2.0000 | Freq: Every day | NASAL | 4 refills | Status: AC

## 2016-01-20 NOTE — Progress Notes (Signed)
Pre visit review using our clinic review tool, if applicable. No additional management support is needed unless otherwise documented below in the visit note. 

## 2016-01-20 NOTE — Progress Notes (Signed)
HPI:  ACUTE VISIT:  Chief Complaint  Patient presents with  . URI    congestion again, not as bad as last time.    Justin Strong is a 25 y.o. male, who is here today complaining of 2 days Of nasal congestion and rhinorrhea. He also noted that he "stopped breathing for a few seconds" while sleeping, states that he slept better last night. She denies any history of sleep apnea, has not noted dry mouth or odynophagia.   I saw him on 11/19/2015 for similar symptoms, I thought were related with allergic rhinitis. I recommended Flonase nasal spray and OTC antihistaminic. According to patient he felt better while he was taking medications, he discontinued a few days ago because he ran out of the nasal spray. He has not noted or eye itching.    + Non productive cough.  Subjective fever "little", denies chill or myalgias. + Nasal congestion, rhinorrhea, and some post nasal drainage.  Denies chest pain, dyspnea, or wheezing.   No Hx of recent travel. No sick contact. No known insect bite.   OTC medications for this problem: Mucinex. Symptoms otherwise better today.   -Intermittently heartburn, usually exacerbated by eating spicy food.  Denies abdominal pain, nausea, vomiting, changes in bowel habits, blood in stool or melena.      Lab Results  Component Value Date   WBC 5.1 10/26/2015   HGB 13.5 10/26/2015   HCT 40.0 10/26/2015   MCV 80.0 10/26/2015   PLT 156 10/26/2015   GLU 123. Denies Hx of DM or FHx of DM II.  -Initial BP mildly elevated. No prior history of HTN.   Review of Systems  Constitutional: Negative for activity change, appetite change, chills and fatigue.  HENT: Positive for congestion and rhinorrhea. Negative for dental problem, ear pain, facial swelling, mouth sores, postnasal drip, sneezing, sore throat, trouble swallowing and voice change.   Eyes: Negative for discharge, redness, itching and visual disturbance.  Respiratory: Positive  for apnea (?) and cough. Negative for chest tightness, shortness of breath and wheezing.   Cardiovascular: Negative for chest pain, palpitations and leg swelling.  Gastrointestinal: Negative for abdominal pain, nausea and vomiting.       No changes in bowel habits.  Musculoskeletal: Negative for back pain, joint swelling and neck pain.  Skin: Negative for rash.  Neurological: Negative for syncope, weakness and headaches.  Hematological: Negative for adenopathy. Does not bruise/bleed easily.      No current outpatient prescriptions on file prior to visit.   No current facility-administered medications on file prior to visit.      No past medical history on file. No Known Allergies  Social History   Social History  . Marital status: Single    Spouse name: N/A  . Number of children: N/A  . Years of education: N/A   Social History Main Topics  . Smoking status: Never Smoker  . Smokeless tobacco: None  . Alcohol use No  . Drug use: Unknown  . Sexual activity: Not Asked   Other Topics Concern  . None   Social History Narrative  . None    Vitals:   01/20/16 0819  BP: 140/90  Pulse: 70  Resp: 12  Temp: 97.9 F (36.6 C)   O2 sat 99% at RA.  Body mass index is 31.2 kg/m.      Physical Exam  Nursing note and vitals reviewed. Constitutional: He is oriented to person, place, and time. He appears well-developed. No  distress.  HENT:  Head: Atraumatic.  Nose: Rhinorrhea present. Right sinus exhibits no maxillary sinus tenderness and no frontal sinus tenderness. Left sinus exhibits no maxillary sinus tenderness and no frontal sinus tenderness.  Mouth/Throat: Uvula is midline, oropharynx is clear and moist and mucous membranes are normal.  Hypertrophic turbinates. Post nasal drainage.  Eyes: Conjunctivae and EOM are normal. Pupils are equal, round, and reactive to light.  Neck: No thyromegaly present.  Cardiovascular: Normal rate and regular rhythm.   No murmur  heard. Pulses:      Dorsalis pedis pulses are 2+ on the right side, and 2+ on the left side.  Respiratory: Effort normal and breath sounds normal. No respiratory distress.  GI: Soft. He exhibits no mass. There is no hepatomegaly. There is no tenderness.  Musculoskeletal: He exhibits no edema.  Lymphadenopathy:    He has no cervical adenopathy.  Neurological: He is alert and oriented to person, place, and time. He has normal strength. Coordination and gait normal.  Skin: Skin is warm. No erythema.  Psychiatric: He has a normal mood and affect.  Well groomed, good eye contact.      ASSESSMENT AND PLAN:     Justin Strong was seen today for uri.  Diagnoses and all orders for this visit:  Allergic rhinitis, unspecified allergic rhinitis type  I still think symptoms are mainly related to allergies. I recommended checking temperature if he feels like he is having fever, he could also have an ongoing mild viral illness as well. Instructed to resume OTC antihistaminic, Allegra 180 mg daily is adequate option. He will try Nasonex nasal spray.        In regard to stopping breathing, he has no prior history of apnea, could be related to nasal congestion; no further work-up recommended today but if persistent we may need to consider sleep study.   Follow-up in 6-8 weeks.  -     mometasone (NASONEX) 50 MCG/ACT nasal spray; Place 2 sprays into the nose daily.  Hyperglycemia  A1C done here today and in normal range.  -     POC HgB A1c  Elevated blood pressure (not hypertension)  Rechecked and improved. Recommend checking BP periodically at home. Low salt and healthy diet as well as regular exercise. Avoid OTC cold medications or decongestants.     Gastroesophageal reflux disease without esophagitis  GERD precautions discussed. Recommend avoiding intake of food he has already identified as trigger factor. If symptoms continue despite of dietary changes, we may need to consider  PPI.       Return in 2 months (on 03/21/2016) for allergic rhinitis and GERD 6-8 weeks f/u.     -Justin Strong advised to return or notify a doctor immediately if symptoms worsen or persist or new concerns arise.       Justin Hillock G. [place], MD  [facility]. [place] office.

## 2016-01-20 NOTE — Patient Instructions (Addendum)
A few things to remember from today's visit:   Allergic rhinitis, unspecified allergic rhinitis type  Hyperglycemia - Plan: POC HgB A1c  Elevated blood pressure (not hypertension)  Gastroesophageal reflux disease without esophagitis  Allegra 180 mg daily.  Avoid over-the-counter cold medications because they can increase blood pressure. Monitor blood pressure at home if possible, goal is less than 140/90.  Avoid foods that make your symptoms worse, for example coffee, chocolate,pepermeint,alcohol, and greasy food. Raising the head of your bed about 6 inches may help with nocturnal symptoms.   Weight loss (if you are overweight). Avoid lying down for 3 hours after eating.  Instead 3 large meals daily try small and more frequent meals during the day.  For now no treatment with medication but if symptoms continue we may need to consider pharmacologic treatment.   Please be sure medication list is accurate. If a new problem present, please set up appointment sooner than planned today.

## 2016-03-23 ENCOUNTER — Ambulatory Visit: Payer: Self-pay | Admitting: Family Medicine

## 2016-03-23 DIAGNOSIS — Z0289 Encounter for other administrative examinations: Secondary | ICD-10-CM

## 2016-03-29 ENCOUNTER — Emergency Department (HOSPITAL_COMMUNITY)
Admission: EM | Admit: 2016-03-29 | Discharge: 2016-03-29 | Disposition: A | Discharge status: Home / Self Care | Payer: Self-pay | Attending: Emergency Medicine | Admitting: Emergency Medicine

## 2016-03-29 ENCOUNTER — Emergency Department (HOSPITAL_COMMUNITY): Payer: Self-pay

## 2016-03-29 ENCOUNTER — Encounter (HOSPITAL_COMMUNITY): Payer: Self-pay

## 2016-03-29 DIAGNOSIS — R072 Precordial pain: Secondary | ICD-10-CM | POA: Insufficient documentation

## 2016-03-29 DIAGNOSIS — R079 Chest pain, unspecified: Secondary | ICD-10-CM

## 2016-03-29 LAB — URINALYSIS, ROUTINE W REFLEX MICROSCOPIC
BILIRUBIN URINE: NEGATIVE
Glucose, UA: NEGATIVE mg/dL
HGB URINE DIPSTICK: NEGATIVE
KETONES UR: NEGATIVE mg/dL
Leukocytes, UA: NEGATIVE
NITRITE: NEGATIVE
PROTEIN: NEGATIVE mg/dL
Specific Gravity, Urine: 1.014 (ref 1.005–1.030)
pH: 6 (ref 5.0–8.0)

## 2016-03-29 LAB — CBC
HEMATOCRIT: 39.7 % (ref 39.0–52.0)
Hemoglobin: 13.8 g/dL (ref 13.0–17.0)
MCH: 27.7 pg (ref 26.0–34.0)
MCHC: 34.8 g/dL (ref 30.0–36.0)
MCV: 79.7 fL (ref 78.0–100.0)
Platelets: 163 10*3/uL (ref 150–400)
RBC: 4.98 MIL/uL (ref 4.22–5.81)
RDW: 12.4 % (ref 11.5–15.5)
WBC: 7.7 10*3/uL (ref 4.0–10.5)

## 2016-03-29 LAB — I-STAT TROPONIN, ED: TROPONIN I, POC: 0 ng/mL (ref 0.00–0.08)

## 2016-03-29 LAB — I-STAT CHEM 8, ED
BUN: 17 mg/dL (ref 6–20)
Calcium, Ion: 1.15 mmol/L (ref 1.15–1.40)
Chloride: 101 mmol/L (ref 101–111)
Creatinine, Ser: 1.2 mg/dL (ref 0.61–1.24)
Glucose, Bld: 108 mg/dL — ABNORMAL HIGH (ref 65–99)
HEMATOCRIT: 43 % (ref 39.0–52.0)
HEMOGLOBIN: 14.6 g/dL (ref 13.0–17.0)
POTASSIUM: 5.4 mmol/L — AB (ref 3.5–5.1)
Sodium: 137 mmol/L (ref 135–145)
TCO2: 30 mmol/L (ref 0–100)

## 2016-03-29 NOTE — ED Notes (Addendum)
"  Pt verbalizes he is stressed out over his two women relationship/  His money and not getting good sleep".  Culture awareness from [place].  Pt appears to have red eyes from lack of sleep and 10 hr work days x 5 days a week.

## 2016-03-29 NOTE — Discharge Instructions (Signed)

## 2016-03-29 NOTE — ED Provider Notes (Signed)
[facility]ER[facility] DEPT Provider Note   CSN: [id number] Arrival date & time: 03/29/16  0059     History   Chief Complaint Chief Complaint  Patient presents with  . Chest Pain    HPI Justin Strong is a 25 y.o. male.  The history is provided by the patient.  Chest Pain   This is a new problem. The current episode started more than 2 days ago. The problem occurs daily. The problem has not changed since onset.Associated with: while at work. The pain is present in the substernal region. The quality of the pain is described as burning and pressure-like. The pain does not radiate. Associated symptoms include cough. Pertinent negatives include no fever, no hemoptysis and no vomiting. Associated symptoms comments: Mild sob . Risk factors include male gender.  Pertinent negatives for past medical history include no CAD and no PE.  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 Authorizing Provider  mometasone (NASONEX) 50 MCG/ACT nasal spray Place 2 sprays into the nose daily. Patient not taking: Reported on 03/29/2016 01/20/16   Betty G Swaz[place]Jordan, MD    Family History History reviewed. No pertinent family history.  Social History Social History  Substance Use Topics  . Smoking status: Never Smoker  . Smokeless tobacco: Never Used  . Alcohol use No     Allergies   Patient has no known allergies.   Review of Systems Review of Systems  Constitutional: Negative for fever.  Respiratory: Positive for cough. Negative for hemoptysis.   Cardiovascular: Positive for chest  pain.  Gastrointestinal: Positive for diarrhea. Negative for vomiting.  All other systems reviewed and are negative.    Physical Exam Updated Vital Signs BP 133/71 (BP Location: Right Arm)   Pulse 62   Temp 98.6 F (37 C) (Oral)   Resp 18   Ht 5\' 4"  (1.626 m)   Wt 79.4 kg   SpO2 99%   BMI 30.04 kg/m   Physical Exam CONSTITUTIONAL: Well developed/well nourished, pt asleep on arrival and easily arousable, no distress noted HEAD: Normocephalic/atraumatic EYES: EOMI/PERRL ENMT: Mucous membranes moist NECK: supple no meningeal signs SPINE/BACK:entire spine nontender CV: S1/S2 noted, no murmurs/rubs/gallops noted LUNGS: Lungs are clear to auscultation bilaterally, no apparent distress ABDOMEN: soft, nontender, no rebound or guarding, bowel sounds noted throughout abdomen GU:no cva tenderness NEURO: Pt is awake/alert/appropriate, moves all extremitiesx4.  No facial droop.   EXTREMITIES: pulses normal/equal, full ROM, no calf tenderness/edema noted SKIN: warm, color normal PSYCH: no abnormalities of mood noted, alert and oriented to situation   ED Treatments / Results  Labs (all labs ordered are listed, but only abnormal results are displayed) Labs Reviewed  I-STAT CHEM 8, ED - Abnormal; Notable for the following:       Result Value   Potassium 5.4 (*)    Glucose, Bld 108 (*)    All other components within normal limits  CBC  URINALYSIS, ROUTINE W REFLEX MICROSCOPIC (NOT AT [facility])  I-STAT TROPOININ, ED    EKG  EKG  Interpretation  Date/Time:  Tuesday March 29 2016 01:14:13 EST Ventricular Rate:  78 PR Interval:    QRS Duration: 80 QT Interval:  376 QTC Calculation: 429 R Axis:   61 Text Interpretation:  Sinus rhythm Baseline wander in lead(s) V2 T wave inversion No significant change since last tracing Confirmed by Bebe ShaggyWICKLINE  MD, Emilyrose Darrah ([id number]) on 03/29/2016 1:19:11 AM       Radiology Dg Chest 2 View  Result Date: 03/29/2016 CLINICAL DATA:  Left-sided chest  pain and dyspnea for 3 days EXAM: CHEST  2 VIEW COMPARISON:  10/22/2015 FINDINGS: The heart size and mediastinal contours are within normal limits. Both lungs are clear. The visualized skeletal structures are unremarkable. IMPRESSION: No active cardiopulmonary disease. Electronically Signed   By: Tollie Ethavid  Kwon M.D.   On: 03/29/2016 02:00    Procedures Procedures (including critical care time)  Medications Ordered in ED Medications - No data to display   Initial Impression / Assessment and Plan / ED Course  I have reviewed the triage vital signs and the nursing notes.  Pertinent labs & imaging results that were available during my care of the patient were reviewed by me and considered in my medical decision making (see chart for details).  Clinical Course     Pt well appearing His pain is resolved Workup unremarkable He appears PERC negative I doubt ACS given history/exam EKG unchanged from prior Will d/c home Suspect potassium elevation is lab error Final Clinical Impressions(s) / ED Diagnoses   Final diagnoses:  Chest pain, unspecified type    New Prescriptions Discharge Medication List as of 03/29/2016  5:41 AM       Zadie Rhineonald Emony Dormer, MD 03/29/16 [id number]

## 2016-03-29 NOTE — ED Triage Notes (Signed)
Pt complains of left sided chest pain that started three days ago, he describes it as a little pressure Pt also states that he's been short of breath and nauseated

## 2016-03-29 NOTE — ED Notes (Signed)
Provider in room  

## 2016-06-02 ENCOUNTER — Emergency Department (HOSPITAL_BASED_OUTPATIENT_CLINIC_OR_DEPARTMENT_OTHER)
Admission: EM | Admit: 2016-06-02 | Discharge: 2016-06-02 | Disposition: A | Discharge status: Home / Self Care | Payer: 59 | Attending: Emergency Medicine | Admitting: Emergency Medicine

## 2016-06-02 ENCOUNTER — Encounter (HOSPITAL_BASED_OUTPATIENT_CLINIC_OR_DEPARTMENT_OTHER): Payer: Self-pay | Admitting: *Deleted

## 2016-06-02 ENCOUNTER — Emergency Department (HOSPITAL_BASED_OUTPATIENT_CLINIC_OR_DEPARTMENT_OTHER): Payer: 59

## 2016-06-02 DIAGNOSIS — R079 Chest pain, unspecified: Secondary | ICD-10-CM | POA: Diagnosis present

## 2016-06-02 DIAGNOSIS — J069 Acute upper respiratory infection, unspecified: Secondary | ICD-10-CM | POA: Diagnosis not present

## 2016-06-02 LAB — CBC WITH DIFFERENTIAL/PLATELET
BASOS PCT: 0 %
Basophils Absolute: 0 10*3/uL (ref 0.0–0.1)
EOS ABS: 0.1 10*3/uL (ref 0.0–0.7)
EOS PCT: 1 %
HCT: 39.9 % (ref 39.0–52.0)
Hemoglobin: 13.6 g/dL (ref 13.0–17.0)
LYMPHS ABS: 1.5 10*3/uL (ref 0.7–4.0)
Lymphocytes Relative: 19 %
MCH: 27.5 pg (ref 26.0–34.0)
MCHC: 34.1 g/dL (ref 30.0–36.0)
MCV: 80.8 fL (ref 78.0–100.0)
MONO ABS: 0.9 10*3/uL (ref 0.1–1.0)
MONOS PCT: 12 %
NEUTROS PCT: 68 %
Neutro Abs: 5.4 10*3/uL (ref 1.7–7.7)
PLATELETS: 145 10*3/uL — AB (ref 150–400)
RBC: 4.94 MIL/uL (ref 4.22–5.81)
RDW: 12.1 % (ref 11.5–15.5)
WBC: 7.9 10*3/uL (ref 4.0–10.5)

## 2016-06-02 LAB — BASIC METABOLIC PANEL
Anion gap: 8 (ref 5–15)
BUN: 11 mg/dL (ref 6–20)
CALCIUM: 9.4 mg/dL (ref 8.9–10.3)
CO2: 28 mmol/L (ref 22–32)
CREATININE: 1.04 mg/dL (ref 0.61–1.24)
Chloride: 103 mmol/L (ref 101–111)
GFR calc non Af Amer: >60 mL/min (ref >60)
GLUCOSE: 102 mg/dL — AB (ref 65–99)
Potassium: 3.7 mmol/L (ref 3.5–5.1)
Sodium: 139 mmol/L (ref 135–145)

## 2016-06-02 LAB — TROPONIN I

## 2016-06-02 MED ORDER — OXYMETAZOLINE HCL 0.05 % NA SOLN
1.0000 | Freq: Once | NASAL | Status: AC
  Administered 2016-06-02: 1 via NASAL
  Filled 2016-06-02: qty 15

## 2016-06-02 NOTE — ED Notes (Signed)
Pt verbalizes understanding of d/c instructions and denies any further needs at this time. 

## 2016-06-02 NOTE — ED Provider Notes (Signed)
[facility]   CSN: [id number] Arrival date & time: 06/02/16  1702  By signing my name below, I, Alyssa Grove, attest that this documentation has been prepared under the direction and in the presence of Jacalyn Lefevre, MD. Electronically Signed: Alyssa Grove, ED Scribe. 06/02/16. 7:36 PM.   History   Chief Complaint Chief Complaint  Patient presents with  . Dizziness   The history is provided by the patient. No language interpreter was used.   HPI Comments: Justin Strong is a 26 y.o. male who presents to the Emergency Department complaining of gradual onset, episode of lightheadedness onset 2 hours PTA. Pt experienced sharp central chest pain and shortness of breath 10-15 minutes prior to episode. Symptoms gradually improved and he does not currently have any prior symptoms, but now he reports new coughing and sneezing. He denies recent known sick contact. Pt did not receive a flu shot this year. He denies fever and chills.  He has a newborn and was worried that he had the flu and did not want to give it to the baby.  History reviewed. No pertinent past medical history.  There are no active problems to display for this patient.   History reviewed. No pertinent surgical history.     Home Medications    Prior to Admission medications   Medication Sig Start Date End Date Taking? Authorizing Provider  mometasone (NASONEX) 50 MCG/ACT nasal spray Place 2 sprays into the nose daily. Patient not taking: Reported on 03/29/2016 01/20/16   Betty G [place], MD    Family History No family history on file.  Social History Social History  Substance Use Topics  . Smoking status: Never Smoker  . Smokeless tobacco: Never Used  . Alcohol use No     Allergies   Patient has no known allergies.   Review of Systems Review of Systems  Constitutional: Negative for chills and fever.  HENT: Positive for sneezing.   Respiratory: Positive for cough and shortness of  breath.   Cardiovascular: Positive for chest pain.  Neurological: Positive for light-headedness.  All other systems reviewed and are negative.    Physical Exam Updated Vital Signs BP 114/65 (BP Location: Right Arm)   Pulse 71   Temp 98.4 F (36.9 C) (Oral)   Resp 20   Ht 5' 3.5" (1.613 m)   Wt 175 lb (79.4 kg)   SpO2 99%   BMI 30.51 kg/m   Physical Exam  Constitutional: He is oriented to person, place, and time. He appears well-developed and well-nourished.  HENT:  Head: Normocephalic and atraumatic.  Right Ear: External ear normal.  Left Ear: External ear normal.  Nose: Nose normal.  Mouth/Throat: Oropharynx is clear and moist.  Eyes: Conjunctivae and EOM are normal. Pupils are equal, round, and reactive to light.  Neck: Normal range of motion. Neck supple.  Cardiovascular: Normal rate, regular rhythm, normal heart sounds and intact distal pulses.   Pulmonary/Chest: Effort normal and breath sounds normal.  Abdominal: Soft. Bowel sounds are normal.  Musculoskeletal: Normal range of motion.  Neurological: He is alert and oriented to person, place, and time.  Skin: Skin is warm. Capillary refill takes less than 2 seconds.  Psychiatric: He has a normal mood and affect. His behavior is normal. Judgment and thought content normal.  Nursing note and vitals reviewed.    ED Treatments / Results  DIAGNOSTIC STUDIES: Oxygen Saturation is 99% on RA, normal by my interpretation.    COORDINATION OF CARE: 6:14  PM Discussed treatment plan with pt at bedside which includes Blood work, EKG and Chest XR and pt agreed to plan.  Labs (all labs ordered are listed, but only abnormal results are displayed) Labs Reviewed  BASIC METABOLIC PANEL - Abnormal; Notable for the following:       Result Value   Glucose, Bld 102 (*)    All other components within normal limits  CBC WITH DIFFERENTIAL/PLATELET - Abnormal; Notable for the following:    Platelets 145 (*)    All other components  within normal limits  TROPONIN I    EKG  EKG Interpretation  Date/Time:  Thursday June 02 2016 18:30:19 EST Ventricular Rate:  64 PR Interval:    QRS Duration: 79 QT Interval:  388 QTC Calculation: 401 R Axis:   52 Text Interpretation:  Sinus rhythm Confirmed by Particia NearingHAVILAND MD, Aldean Suddeth ([id number]) on 06/02/2016 6:50:02 PM       Radiology Dg Chest 2 View  Result Date: 06/02/2016 CLINICAL DATA:  Dizziness and syncope today with shortness of breath EXAM: CHEST  2 VIEW COMPARISON:  March 29, 2016 FINDINGS: The heart size and mediastinal contours are within normal limits. There is no focal infiltrate, pulmonary edema, or pleural effusion. The visualized skeletal structures are unremarkable. IMPRESSION: No active cardiopulmonary disease. Electronically Signed   By: Sherian ReinWei-Chen  Lin M.D.   On: 06/02/2016 19:28    Procedures Procedures (including critical care time)  Medications Ordered in ED Medications  oxymetazoline (AFRIN) 0.05 % nasal spray 1 spray (1 spray Each Nare Given 06/02/16 1833)     Initial Impression / Assessment and Plan / ED Course  I have reviewed the triage vital signs and the nursing notes.  Pertinent labs & imaging results that were available during my care of the patient were reviewed by me and considered in my medical decision making (see chart for details).  Clinical Course     Pt looks good.  He has no sx now.  No fever.  He knows to return if worse.  Final Clinical Impressions(s) / ED Diagnoses   Final diagnoses:  Viral upper respiratory tract infection    New Prescriptions New Prescriptions   No medications on file  I personally performed the services described in this documentation, which was scribed in my presence. The recorded information has been reviewed and is accurate.    Jacalyn LefevreJulie Menachem Urbanek, MD 06/02/16 [phone]

## 2016-07-01 ENCOUNTER — Emergency Department (HOSPITAL_COMMUNITY)
Admission: EM | Admit: 2016-07-01 | Discharge: 2016-07-01 | Disposition: A | Discharge status: Home / Self Care | Payer: 59 | Attending: Emergency Medicine | Admitting: Emergency Medicine

## 2016-07-01 ENCOUNTER — Encounter (HOSPITAL_COMMUNITY): Payer: Self-pay

## 2016-07-01 DIAGNOSIS — G4700 Insomnia, unspecified: Secondary | ICD-10-CM

## 2016-07-01 DIAGNOSIS — Z79899 Other long term (current) drug therapy: Secondary | ICD-10-CM | POA: Insufficient documentation

## 2016-07-01 DIAGNOSIS — R059 Cough, unspecified: Secondary | ICD-10-CM

## 2016-07-01 DIAGNOSIS — R05 Cough: Secondary | ICD-10-CM | POA: Diagnosis not present

## 2016-07-01 NOTE — ED Provider Notes (Signed)
[facility] Provider Note   CSN: [id number] Arrival date & time: 07/01/16  [id number]     History   Chief Complaint Chief Complaint  Patient presents with  . Cough    HPI Justin Strong is a 26 y.o. male.  He presents for evaluation of cough, which he describes as a sensation in his throat, which prevents him from sleeping. This has been present for 3 days. The cough is nonproductive. He did not go to work today because he was uncomfortable. He denies sputum production, fever, chills, nausea, vomiting, weakness or dizziness. There are no other known modifying factors. He was recently in the emergency department, diagnosed with URI. There are no other known modifying factors.   HPI  History reviewed. No pertinent past medical history.  There are no active problems to display for this patient.   History reviewed. No pertinent surgical history.     Home Medications    Prior to Admission medications   Medication Sig Start Date End Date Taking? Authorizing Provider  mometasone (NASONEX) 50 MCG/ACT nasal spray Place 2 sprays into the nose daily. Patient not taking: Reported on 03/29/2016 01/20/16   Betty G [place], MD    Family History No family history on file.  Social History Social History  Substance Use Topics  . Smoking status: Never Smoker  . Smokeless tobacco: Never Used  . Alcohol use No     Allergies   Patient has no known allergies.   Review of Systems Review of Systems  All other systems reviewed and are negative.    Physical Exam Updated Vital Signs BP 117/75 (BP Location: Left Arm)   Pulse (!) 57   Temp 98.3 F (36.8 C) (Oral)   Resp 18   SpO2 100%   Physical Exam  Constitutional: He is oriented to person, place, and time. He appears well-developed and well-nourished. No distress.  HENT:  Head: Normocephalic and atraumatic.  Right Ear: External ear normal.  Left Ear: External ear normal.  Eyes: Conjunctivae and EOM are normal. Pupils  are equal, round, and reactive to light.  Neck: Normal range of motion and phonation normal. Neck supple.  Cardiovascular: Normal rate, regular rhythm and normal heart sounds.   Pulmonary/Chest: Effort normal and breath sounds normal. He exhibits no bony tenderness.  Occasional non-congested cough noted.  Abdominal: Soft. There is no tenderness.  Musculoskeletal: Normal range of motion.  Neurological: He is alert and oriented to person, place, and time. No cranial nerve deficit or sensory deficit. He exhibits normal muscle tone. Coordination normal.  Skin: Skin is warm, dry and intact.  Psychiatric: He has a normal mood and affect. His behavior is normal. Judgment and thought content normal.  Nursing note and vitals reviewed.    ED Treatments / Results  Labs (all labs ordered are listed, but only abnormal results are displayed) Labs Reviewed - No data to display  EKG  EKG Interpretation None       Radiology No results found.  Procedures Procedures (including critical care time)  Medications Ordered in ED Medications - No data to display   Initial Impression / Assessment and Plan / ED Course  I have reviewed the triage vital signs and the nursing notes.  Pertinent labs & imaging results that were available during my care of the patient were reviewed by me and considered in my medical decision making (see chart for details).     Medications - No data to display  Patient Vitals for the past 24  hrs:  BP Temp Temp src Pulse Resp SpO2  07/01/16 0753 117/75 98.3 F (36.8 C) Oral (!) 57 18 100 %  07/01/16 0540 - - - - - 97 %  07/01/16 0537 - - - - - 97 %    8:01 AM Reevaluation with update and discussion. After initial assessment and treatment, an updated evaluation reveals No change in clinical status. He is resting comfortably and typing something on his telephone. Findings discussed with the patient and all questions were answered. Octavio Matheney L    Final Clinical  Impressions(s) / ED Diagnoses   Final diagnoses:  Cough  Insomnia, unspecified type    Nonspecific cough, without findings for serious significant infectious, metabolic, all hemodynamic abnormalities. Insomnia, also nonspecific  Nursing Notes Reviewed/ Care Coordinated Applicable Imaging Reviewed Interpretation of Laboratory Data incorporated into ED treatment  The patient appears reasonably screened and/or stabilized for discharge and I doubt any other medical condition or other [facility] requiring further screening, evaluation, or treatment in the ED at this time prior to discharge.  Plan: Home Medications- Robitussin-DM and Benadryl when necessary; Home Treatments- rest, fluids; return here if the recommended treatment, does not improve the symptoms; Recommended follow up- PCP, when necessary     New Prescriptions New Prescriptions   No medications on file     Mancel Bale, MD 07/01/16 [phone]

## 2016-07-01 NOTE — ED Triage Notes (Signed)
Pt c/o of cough that started 2 weeks ago Pt also c/o congestion and sinus pressure Pt has not taken anything for symptoms

## 2016-07-01 NOTE — Discharge Instructions (Signed)
Your cough is likely secondary to mucus. This can occur for a number of reasons. It will likely improve in a few days. To help the cough. You can use Robitussin-DM.  If you need help with sleep, try taking Benadryl 25 mg one hour before bedtime.

## 2016-11-14 ENCOUNTER — Other Ambulatory Visit: Payer: Self-pay

## 2016-11-20 NOTE — Progress Notes (Deleted)
     HPI:  Mr. Justin Strong is a 26 y.o.male here today for his routine physical examination. [place] was [place] and has been in [place] since age 26.  He lives with ***  Regular exercise 3 or more times per week: *** Following a healthy diet: ***   Chronic medical problems: Allergic rhinitis.  Hx of STD's: ***   There is no immunization history on file for this patient.   -Denies high alcohol intake, tobacco use, or Hx of illicit drug use.  -Concerns and/or follow up today: ***    Review of Systems   Current Outpatient Prescriptions on File Prior to Visit  Medication Sig Dispense Refill  . mometasone (NASONEX) 50 MCG/ACT nasal spray Place 2 sprays into the nose daily. (Patient not taking: Reported on 03/29/2016) 17 g 4   No current facility-administered medications on file prior to visit.      No past medical history on file.  No Known Allergies  No family history on file.  Social History   Social History  . Marital status: Single    Spouse name: N/A  . Number of children: N/A  . Years of education: N/A   Social History Main Topics  . Smoking status: Never Smoker  . Smokeless tobacco: Never Used  . Alcohol use No  . Drug use: No  . Sexual activity: Not on file   Other Topics Concern  . Not on file   Social History Narrative  . No narrative on file     There were no vitals filed for this visit. There is no height or weight on file to calculate BMI.  @LASTSAO2 (3)@  Wt Readings from Last 3 Encounters:  06/02/16 175 lb (79.4 kg)  03/29/16 175 lb (79.4 kg)  01/20/16 176 lb 2 oz (79.9 kg)        Physical Exam      ASSESSMENT AND PLAN:   Discussed the following assessment and plan:   There are no diagnoses linked to this encounter.       No Follow-up on file.    Betty G. Swazil[place]Jordan, MD  [facility]. [place] office.

## 2016-11-21 ENCOUNTER — Encounter: Payer: Self-pay | Admitting: Family Medicine

## 2016-11-21 DIAGNOSIS — Z0289 Encounter for other administrative examinations: Secondary | ICD-10-CM

## 2017-10-21 IMAGING — DX DG CHEST 2V
2 series · 2 of 2 positions shown · non-contrast
Comparison: March 29, 2016

CLINICAL DATA: Dizziness and syncope today with shortness of breath

EXAM:
CHEST  2 VIEW

[chest pa]
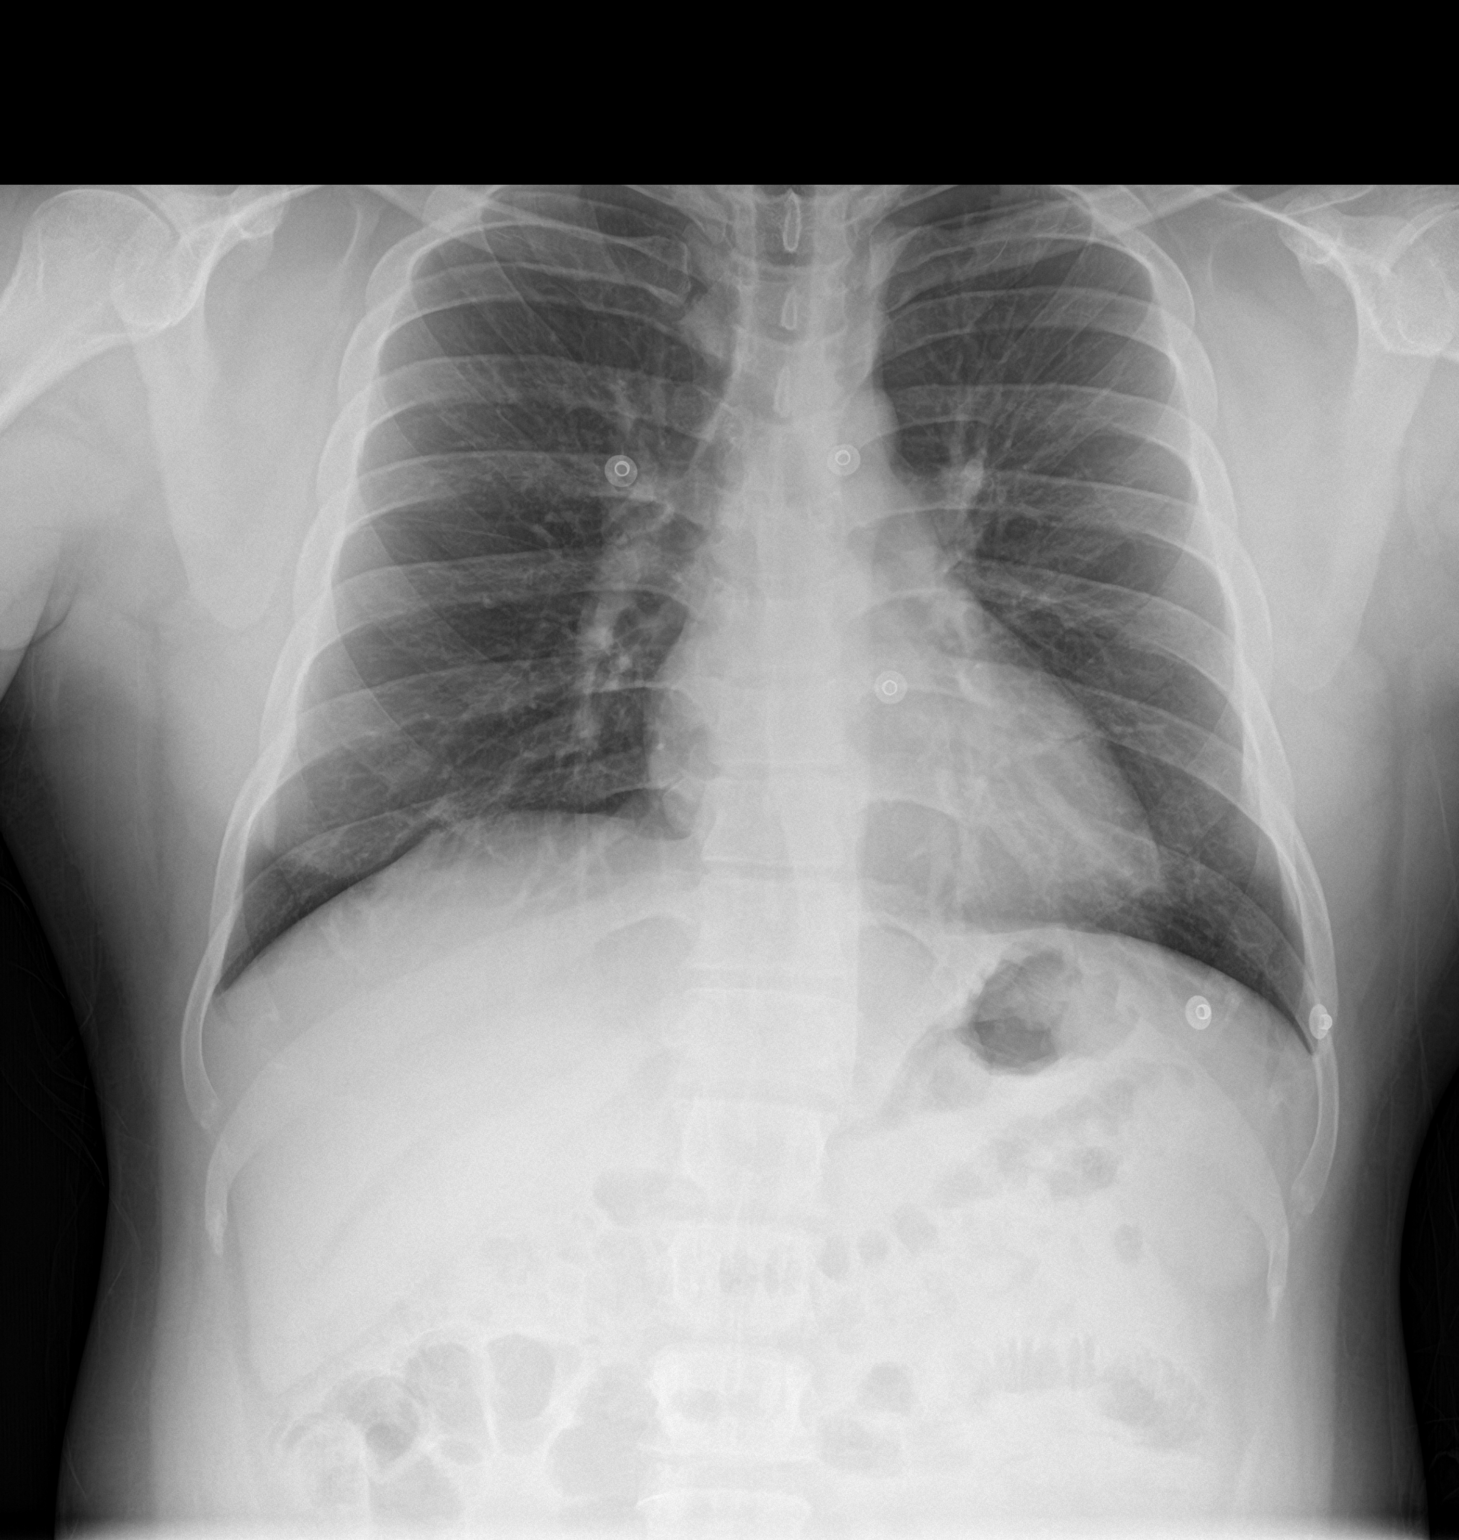

[chest lat]
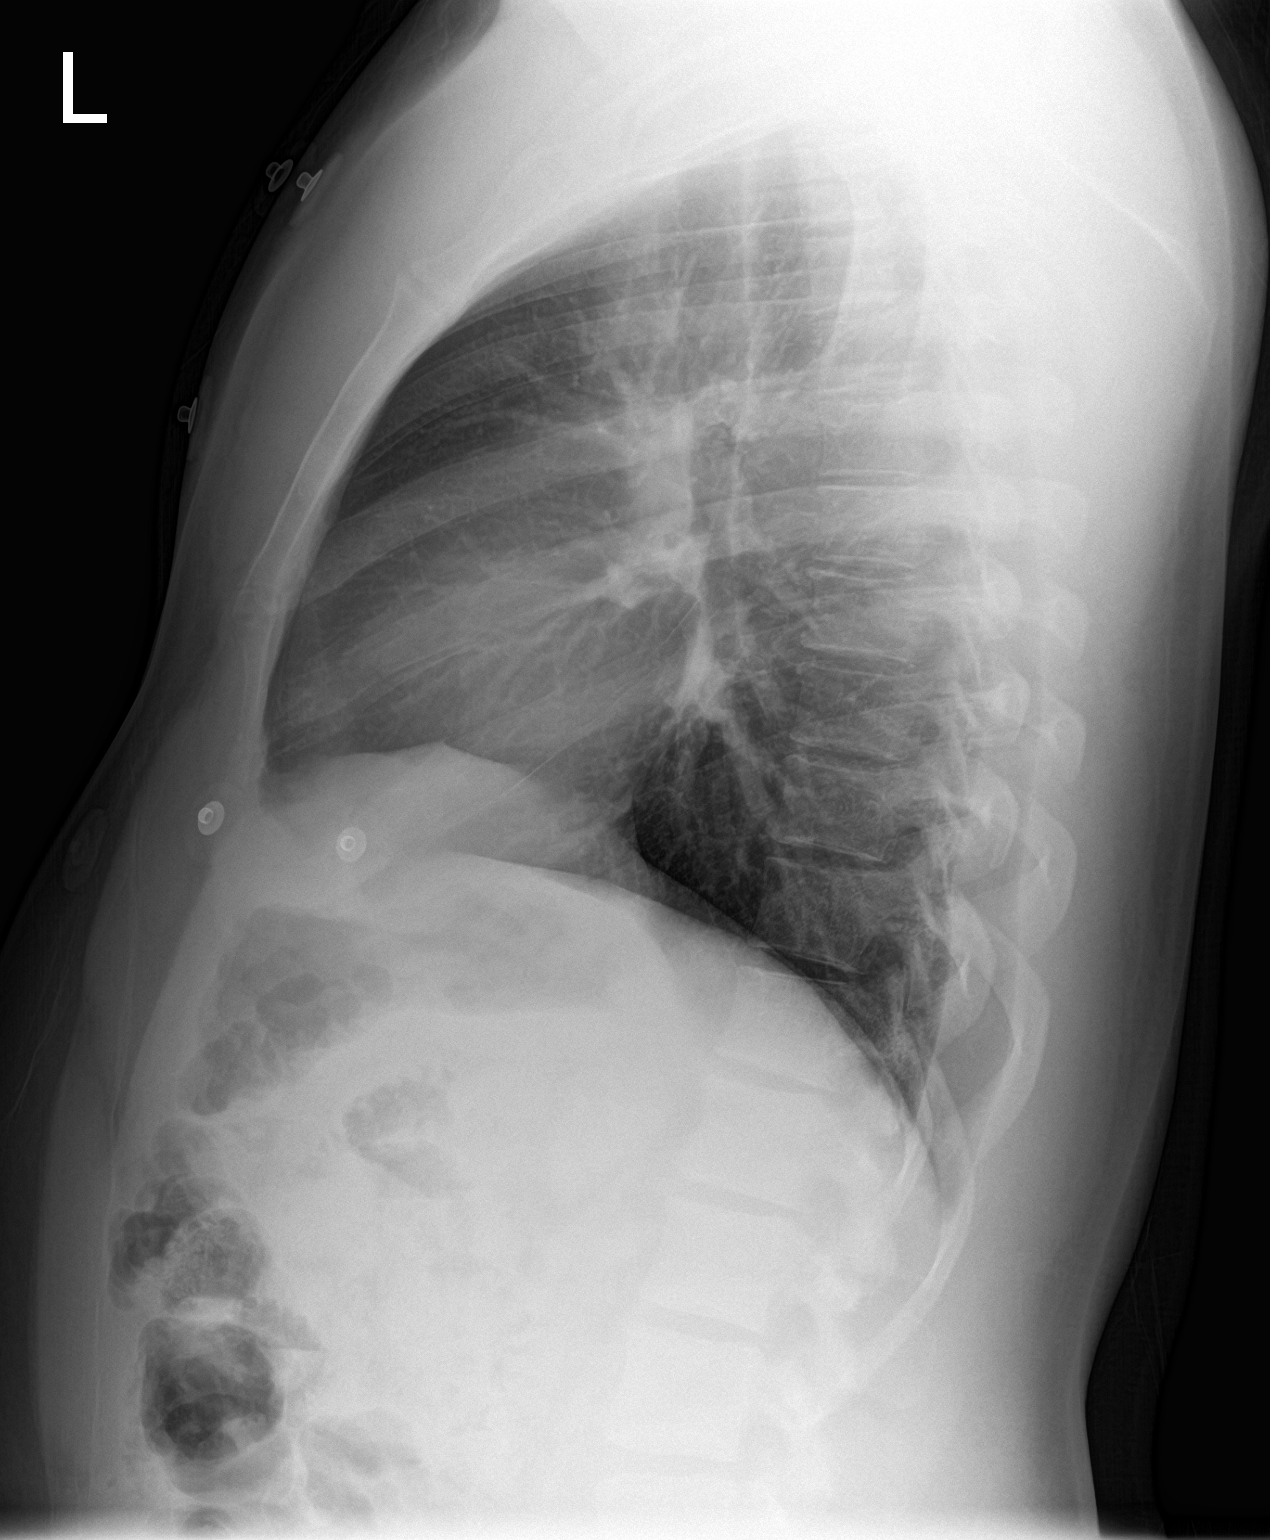

[2 of 2 positions shown; findings below may reference images not displayed]

FINDINGS: The heart size and mediastinal contours are within normal limits.
There is no focal infiltrate, pulmonary edema, or pleural effusion.
The visualized skeletal structures are unremarkable.
IMPRESSION: No active cardiopulmonary disease.
